# Patient Record
Sex: Male | Born: 1945 | Race: Black or African American | Hispanic: No | Marital: Single | Smoking: Never smoker
Health system: Southern US, Community
[De-identification: ages and names within clinical notes are randomized; demographics above are authoritative.]

## PROBLEM LIST (undated history)

## (undated) DIAGNOSIS — I10 Essential (primary) hypertension: Secondary | ICD-10-CM

---

## 2013-11-06 ENCOUNTER — Other Ambulatory Visit: Payer: Self-pay

## 2013-11-06 ENCOUNTER — Encounter (HOSPITAL_BASED_OUTPATIENT_CLINIC_OR_DEPARTMENT_OTHER): Payer: Self-pay | Admitting: Emergency Medicine

## 2013-11-06 ENCOUNTER — Emergency Department (HOSPITAL_BASED_OUTPATIENT_CLINIC_OR_DEPARTMENT_OTHER): Payer: Medicare Other

## 2013-11-06 ENCOUNTER — Emergency Department (HOSPITAL_BASED_OUTPATIENT_CLINIC_OR_DEPARTMENT_OTHER)
Admission: EM | Admit: 2013-11-06 | Discharge: 2013-11-06 | Disposition: A | Discharge status: Other Acute Inpatient Hospital | Payer: Medicare Other | Attending: Emergency Medicine | Admitting: Emergency Medicine

## 2013-11-06 DIAGNOSIS — R911 Solitary pulmonary nodule: Secondary | ICD-10-CM

## 2013-11-06 DIAGNOSIS — R55 Syncope and collapse: Secondary | ICD-10-CM | POA: Insufficient documentation

## 2013-11-06 DIAGNOSIS — R000 Tachycardia, unspecified: Secondary | ICD-10-CM | POA: Insufficient documentation

## 2013-11-06 DIAGNOSIS — I10 Essential (primary) hypertension: Secondary | ICD-10-CM | POA: Insufficient documentation

## 2013-11-06 DIAGNOSIS — N19 Unspecified kidney failure: Secondary | ICD-10-CM | POA: Insufficient documentation

## 2013-11-06 DIAGNOSIS — Z79899 Other long term (current) drug therapy: Secondary | ICD-10-CM | POA: Insufficient documentation

## 2013-11-06 HISTORY — DX: Essential (primary) hypertension: I10

## 2013-11-06 LAB — URINE MICROSCOPIC-ADD ON

## 2013-11-06 LAB — COMPREHENSIVE METABOLIC PANEL
ALK PHOS: 95 U/L (ref 39–117)
ALT: 34 U/L (ref 0–53)
AST: 43 U/L — AB (ref 0–37)
Albumin: 4.1 g/dL (ref 3.5–5.2)
BUN: 27 mg/dL — ABNORMAL HIGH (ref 6–23)
CALCIUM: 9.7 mg/dL (ref 8.4–10.5)
CO2: 20 meq/L (ref 19–32)
Chloride: 106 mEq/L (ref 96–112)
Creatinine, Ser: 2.9 mg/dL — ABNORMAL HIGH (ref 0.50–1.35)
GFR calc Af Amer: 24 mL/min — ABNORMAL LOW (ref >90)
GFR calc non Af Amer: 21 mL/min — ABNORMAL LOW (ref >90)
Glucose, Bld: 95 mg/dL (ref 70–99)
POTASSIUM: 4.2 meq/L (ref 3.7–5.3)
Sodium: 145 mEq/L (ref 137–147)
Total Bilirubin: 0.6 mg/dL (ref 0.3–1.2)
Total Protein: 7.3 g/dL (ref 6.0–8.3)

## 2013-11-06 LAB — CBC WITH DIFFERENTIAL/PLATELET
BASOS ABS: 0 10*3/uL (ref 0.0–0.1)
Basophils Relative: 0 % (ref 0–1)
EOS PCT: 1 % (ref 0–5)
Eosinophils Absolute: 0.1 10*3/uL (ref 0.0–0.7)
HCT: 35.6 % — ABNORMAL LOW (ref 39.0–52.0)
Hemoglobin: 11.8 g/dL — ABNORMAL LOW (ref 13.0–17.0)
LYMPHS ABS: 1.3 10*3/uL (ref 0.7–4.0)
Lymphocytes Relative: 25 % (ref 12–46)
MCH: 31.3 pg (ref 26.0–34.0)
MCHC: 33.1 g/dL (ref 30.0–36.0)
MCV: 94.4 fL (ref 78.0–100.0)
Monocytes Absolute: 0.6 10*3/uL (ref 0.1–1.0)
Monocytes Relative: 11 % (ref 3–12)
NEUTROS ABS: 3.3 10*3/uL (ref 1.7–7.7)
Neutrophils Relative %: 63 % (ref 43–77)
PLATELETS: 146 10*3/uL — AB (ref 150–400)
RBC: 3.77 MIL/uL — AB (ref 4.22–5.81)
RDW: 12.8 % (ref 11.5–15.5)
WBC: 5.3 10*3/uL (ref 4.0–10.5)

## 2013-11-06 LAB — D-DIMER, QUANTITATIVE (NOT AT ARMC): D DIMER QUANT: 2.74 ug{FEU}/mL — AB (ref 0.00–0.48)

## 2013-11-06 LAB — URINALYSIS, ROUTINE W REFLEX MICROSCOPIC
GLUCOSE, UA: NEGATIVE mg/dL
HGB URINE DIPSTICK: NEGATIVE
Ketones, ur: 15 mg/dL — AB
Leukocytes, UA: NEGATIVE
Nitrite: NEGATIVE
Protein, ur: 30 mg/dL — AB
SPECIFIC GRAVITY, URINE: 1.019 (ref 1.005–1.030)
Urobilinogen, UA: 1 mg/dL (ref 0.0–1.0)
pH: 5 (ref 5.0–8.0)

## 2013-11-06 LAB — TROPONIN I: Troponin I: <0.3 ng/mL (ref <0.30)

## 2013-11-06 MED ORDER — SODIUM CHLORIDE 0.9 % IV BOLUS (SEPSIS)
1000.0000 mL | Freq: Once | INTRAVENOUS | Status: AC
  Administered 2013-11-06: 1000 mL via INTRAVENOUS

## 2013-11-06 NOTE — ED Notes (Signed)
Patient transported to X-ray via stretcher per tech. 

## 2013-11-06 NOTE — ED Notes (Signed)
MD at bedside to discuss pending admission to hospital.

## 2013-11-06 NOTE — ED Notes (Signed)
MD at bedside. 

## 2013-11-06 NOTE — ED Provider Notes (Signed)
CSN: [id number]     Arrival date & time    History  This chart was scribed for Glynn Octave, MD by Phillis Haggis, ED Scribe. This patient was seen in room MH09/MH09 and patient care was started at 4:22 PM.    Chief Complaint  Patient presents with  . Altered Mental Status   The history is provided by the patient and a relative. No language interpreter was used.   HPI Comments: Lance Lang is a 68 y.o. male with a history of hypertension who presents to the Emergency Department complaining of LOC onset earlier today. Patient's family states that the restaurant that they were in was hot and the car he was in had no air conditioning. Patient says that he was sitting at a table with his family at a restaurant when he got hot and was sweating, felt "woozy," lightheaded and everything "going dark."  He put his head on the table when he was sitting and lost consciousness.  He was able to hear people talking but was unable to understand them or respond. Patient's family reports that the patient was drooling and sweating, unable to answer questions in LOC state.  Family denies that he had seizure activity, tongue biting or bladder incontinence and states that he was breathing. This lasted 5 minutes and he woke up on his own.   Patient denies associated chest pain, SOB, nausea, vomiting.  He denies recent falls or head injury.  Pt states he was outside in the heat earlier today and did not eat breakfast this morning.  However he felt well today before this episode occurred.  Patient states that he has had one instance of LOC once before. He denies h/o heart, lung, or kidney problems.   PCP- Dr. Janace Hoard in [facility]   Past Medical History  Diagnosis Date  . Hypertension    History reviewed. No pertinent past surgical history. No family history on file. History  Substance Use Topics  . Smoking status: Never Smoker   . Smokeless tobacco: Not on file  . Alcohol Use: 0.6 - 1.2 oz/week    1-2 Shots of  liquor per week     Comment: daily    Review of Systems A complete 10 system review of systems was obtained and all systems are negative except as noted in the HPI and PMH.    Allergies  Review of patient's allergies indicates no known allergies.  Home Medications   Prior to Admission medications   Medication Sig Start Date End Date Taking? Authorizing Jemmie Ledgerwood  amLODipine (NORVASC) 10 MG tablet Take 10 mg by mouth daily.   Yes Historical Adrinne Sze, MD  simvastatin (ZOCOR) 10 MG tablet Take 10 mg by mouth daily.   Yes Historical Skyy Nilan, MD   BP 121/85  Pulse 76  Temp(Src) 98.1 F (36.7 C) (Oral)  Resp 18  SpO2 97% Physical Exam  Nursing note and vitals reviewed. Constitutional: He is oriented to person, place, and time. He appears well-developed and well-nourished. No distress.  HENT:  Head: Normocephalic and atraumatic.  Mouth/Throat: Oropharynx is clear and moist. No oropharyngeal exudate.  Eyes: Conjunctivae and EOM are normal. Pupils are equal, round, and reactive to light.  Neck: Normal range of motion. Neck supple.  No meningismus.  Cardiovascular: Regular rhythm, normal heart sounds and intact distal pulses.  Tachycardia present.   No murmur heard. HR 100  Pulmonary/Chest: Effort normal and breath sounds normal. No respiratory distress.  Abdominal: Soft. There is no tenderness. There is no  rebound and no guarding.  Musculoskeletal: Normal range of motion. He exhibits no edema and no tenderness.  Neurological: He is alert and oriented to person, place, and time. No cranial nerve deficit. He exhibits normal muscle tone. Coordination normal.  No ataxia on finger to nose bilaterally. No pronator drift. 5/5 strength throughout. CN 2-12 intact. Negative Romberg. Equal grip strength. Sensation intact. Gait is normal.   Skin: Skin is warm.  Psychiatric: He has a normal mood and affect. His behavior is normal.    ED Course  Procedures (including critical care  time) DIAGNOSTIC STUDIES: Oxygen Saturation is 97% on room air, normal by my interpretation.    COORDINATION OF CARE: 4:30 PM-Discussed treatment plan which includes IV fluids, CXR and labs with pt at bedside and pt agreed to plan.   5:15 PM- Discussed lab and x-ray results with pt. Discussed plan to admit patient overnight at [facility] for further examination with pt at bedside and pt agreed to plan.   Medications  sodium chloride 0.9 % bolus 1,000 mL (0 mLs Intravenous Stopped 11/06/13 1955)   Labs Review Labs Reviewed  CBC WITH DIFFERENTIAL - Abnormal; Notable for the following:    RBC 3.77 (*)    Hemoglobin 11.8 (*)    HCT 35.6 (*)    Platelets 146 (*)    All other components within normal limits  COMPREHENSIVE METABOLIC PANEL - Abnormal; Notable for the following:    BUN 27 (*)    Creatinine, Ser 2.90 (*)    AST 43 (*)    GFR calc non Af Amer 21 (*)    GFR calc Af Amer 24 (*)    All other components within normal limits  URINALYSIS, ROUTINE W REFLEX MICROSCOPIC - Abnormal; Notable for the following:    Color, Urine AMBER (*)    APPearance CLOUDY (*)    Bilirubin Urine MODERATE (*)    Ketones, ur 15 (*)    Protein, ur 30 (*)    All other components within normal limits  D-DIMER, QUANTITATIVE - Abnormal; Notable for the following:    D-Dimer, Quant 2.74 (*)    All other components within normal limits  URINE MICROSCOPIC-ADD ON - Abnormal; Notable for the following:    Bacteria, UA MANY (*)    Casts HYALINE CASTS (*)    Crystals URIC ACID CRYSTALS (*)    All other components within normal limits  TROPONIN I    Imaging Review Dg Chest 2 View  11/06/2013   CLINICAL DATA:  Altered mental status.  EXAM: CHEST  2 VIEW  COMPARISON:  No priors.  FINDINGS: 1 cm nodular density projecting the right lower lung seen only on the frontal view. No other suspicious appearing pulmonary nodules or masses are noted. No acute consolidative airspace disease. No pleural effusions. No  evidence of pulmonary edema. Heart size and mediastinal contours are unremarkable.  IMPRESSION: 1. No radiographic evidence of acute cardiopulmonary disease. 2. 1 cm nodular opacity projecting over the right lower lung may either be within the right middle or lower lobe, or possibly within an overlying rib (superimposed over the anterior aspect of the right sixth rib). If the patient has a history of smoking, a follow-up noncontrast chest CT would be recommended in the near future to exclude underlying malignancy. In the absence of a history of smoking, or other history of primary malignancy, a followup standing PA and lateral chest radiograph would be recommended in 2-3 months to ensure the stability or resolution of  this finding   Electronically Signed   By: Trudie Reedaniel  Entrikin M.D.   On: 11/06/2013 16:48   Ct Head Wo Contrast  11/06/2013   CLINICAL DATA:  Loss of consciousness earlier today  EXAM: CT HEAD WITHOUT CONTRAST  TECHNIQUE: Contiguous axial images were obtained from the base of the skull through the vertex without intravenous contrast.  COMPARISON:  None.  FINDINGS: There is no evidence of mass effect, midline shift, or extra-axial fluid collections. There is no evidence of a space-occupying lesion or intracranial hemorrhage. There is no evidence of a cortical-based area of acute infarction. There is generalized cerebral atrophy. There is periventricular white matter low attenuation likely secondary to microangiopathy.  The ventricles and sulci are appropriate for the patient's age. The basal cisterns are patent.  Visualized portions of the orbits are unremarkable. Mild right sphenoid mucosal thickening. Unremarkable mastoid sinuses.  The osseous structures are unremarkable.  IMPRESSION: No acute intracranial pathology.   Electronically Signed   By: Elige KoHetal  Patel   On: 11/06/2013 18:48   Ct Chest Wo Contrast  11/06/2013   CLINICAL DATA:  Abnormal chest x-ray.  EXAM: CT CHEST WITHOUT CONTRAST   TECHNIQUE: Multidetector CT imaging of the chest was performed following the standard protocol without IV contrast.  COMPARISON:  Chest x-ray 11/06/2013.  FINDINGS: Mediastinum: Heart size is normal. There is no significant pericardial fluid, thickening or pericardial calcification. No pathologically enlarged mediastinal or hilar lymph nodes. Please note that accurate exclusion of hilar adenopathy is limited on noncontrast CT scans. Esophagus is unremarkable in appearance.  Lungs/Pleura: There are a few scattered tiny subcentimeter pulmonary nodules, largest of which measures only 5 mm in the inferior segment of the lingula (image 47 of series 3), and 7 mm in the posterior aspect of the right lower lobe (image 51 of series 3). No other larger more suspicious appearing pulmonary nodules or masses are otherwise noted. No acute consolidative airspace disease. No pleural effusions.  Upper Abdomen: Unremarkable.  Musculoskeletal: There are no aggressive appearing lytic or blastic lesions noted in the visualized portions of the skeleton.  IMPRESSION: 1. Multiple small pulmonary nodules scattered throughout the lungs bilaterally measuring up to 7 mm in the right lower lobe and 5 mm in the inferior segment of the lingula. These are nonspecific, but attention on followup studies is recommended. If the patient is at high risk for bronchogenic carcinoma, follow-up chest CT at 3-676months is recommended. If the patient is at low risk for bronchogenic carcinoma, follow-up chest CT at 6-12 months is recommended. This recommendation follows the consensus statement: Guidelines for Management of Small Pulmonary Nodules Detected on CT Scans: A Statement from the Fleischner Society as published in Radiology 2005; 237:395-400. 2. No acute findings in the thorax.   Electronically Signed   By: Trudie Reedaniel  Entrikin M.D.   On: 11/06/2013 18:51     EKG Interpretation   Date/Time:  Sunday November 06 2013 16:23:16 EDT Ventricular Rate:  100 PR  Interval:  162 QRS Duration: 80 QT Interval:  342 QTC Calculation: 441 R Axis:   13 Text Interpretation:  Normal sinus rhythm Possible Left atrial enlargement  Nonspecific ST abnormality Abnormal ECG No previous ECGs available  Confirmed by RANCOUR  MD, STEPHEN [id number]) on 11/06/2013 4:37:27 PM      MDM   Final diagnoses:  Syncope, unspecified syncope type  Renal failure  Lung nodule   Syncopal episode while sitting. Proceeded by lightheadedness, dizziness and nausea. No chest pain or shortness of breath.  EKG with nonspecific T wave flattening in lead 3. No comparison Orthostatics positive. Her rate elevates 107 standing.  Cr 2.9. No comparison. BUN 27 D-dimer elevated at 2.74. Chest x-ray abnormal with lung nodule finding.  Unable to perform CT PE due to elevated creatinine. Will perform noncontrast CT to evaluate lung nodule further. Still needs PE rule out with VQ scan.  Admission discussed with regional physician Dr. Mariea ClontsEmokpae. Patient with likely orthostatic syncope but also has elevated creatinine and lung nodule need to be ruled out for PE. Patient and family in agreement with admission.   I personally performed the services described in this documentation, which was scribed in my presence. The recorded information has been reviewed and is accurate.   Glynn OctaveStephen Rancour, MD 11/07/13 0010

## 2013-11-06 NOTE — ED Notes (Signed)
MD notified of ddimer.

## 2013-11-06 NOTE — ED Notes (Signed)
Out to eat with family. Felt lightheaded, sat down. States that he could hear his family talking, but could not respond. States this episode lasted appx 5 minutes. By the time EMS arrived, pt was alert and oriented. Positive for orthostatic changes with EMS. Patient is alert in triage.

## 2013-11-07 LAB — CBG MONITORING, ED: Glucose-Capillary: 86 mg/dL (ref 70–99)

## 2015-01-20 IMAGING — CT CT CHEST W/O CM
2 of 3 series · 15 of 36 positions shown, 18 images · non-contrast
Comparison: Chest x-ray 11/06/2013.

CLINICAL DATA: Abnormal chest x-ray.

EXAM:
CT CHEST WITHOUT CONTRAST
TECHNIQUE: Multidetector CT imaging of the chest was performed following the
standard protocol without IV contrast..

[Series 2: chest 5.0 b31f · axial · 0.73mm/px · z∈[-338,-48]mm · 12 of 68 slices shown, 15 images]
[im 5/68  mediastinal]
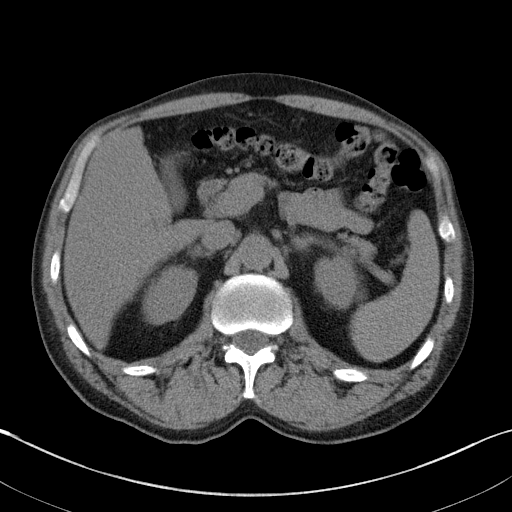
[im 5/68  lung]
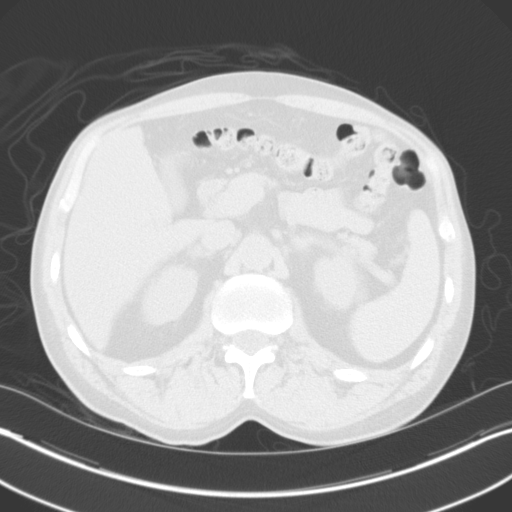
[im 10/68  lung]
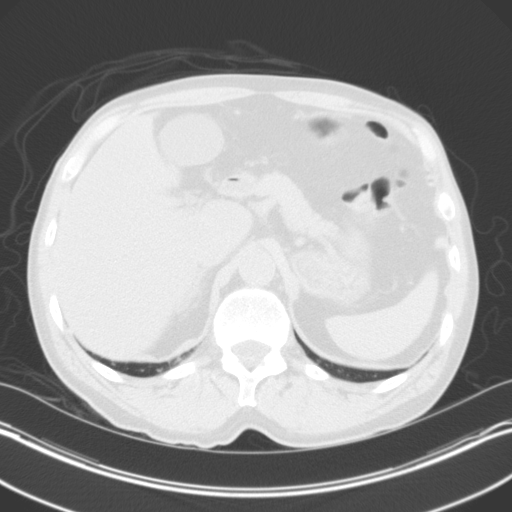
[im 15/68  lung]
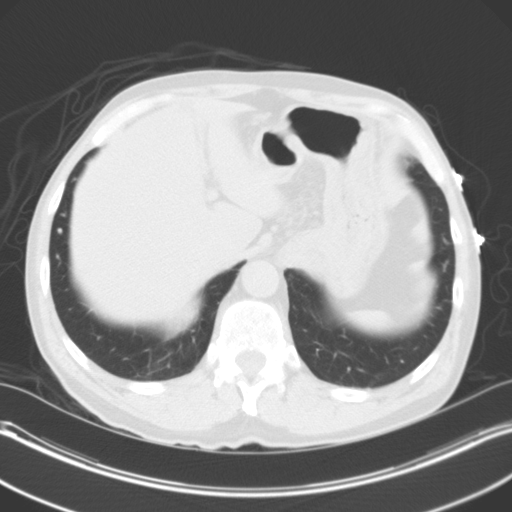
[im 20/68  lung]
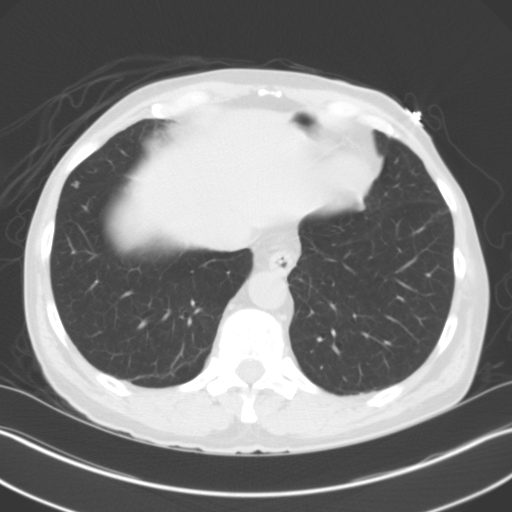
[im 25/68  mediastinal]
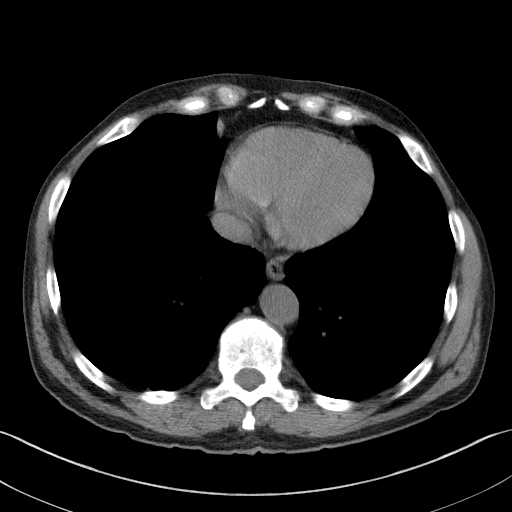
[im 25/68  lung]
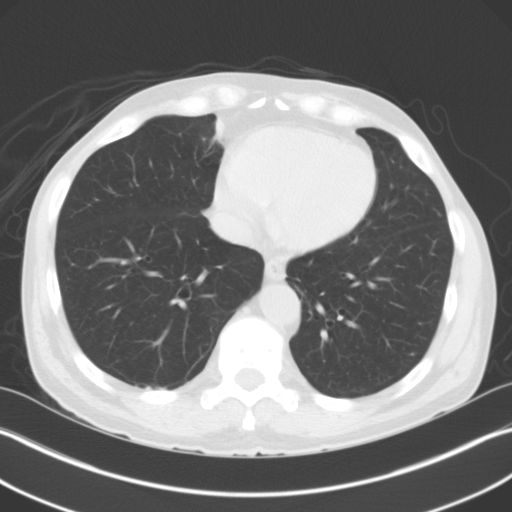
[im 30/68  lung]
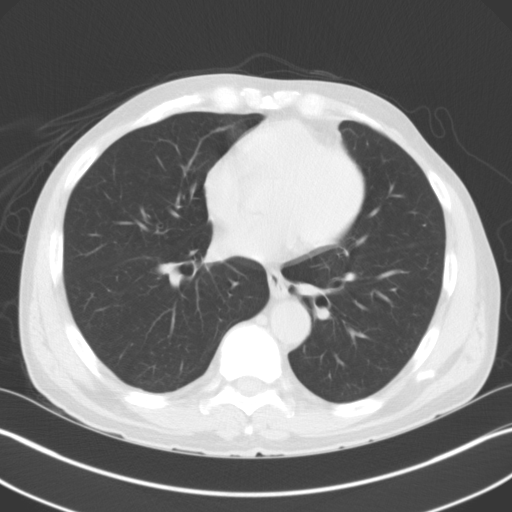
[im 38/68  lung]
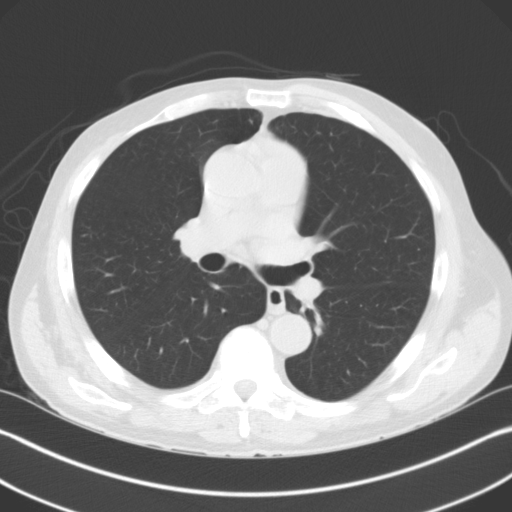
[im 43/68  lung]
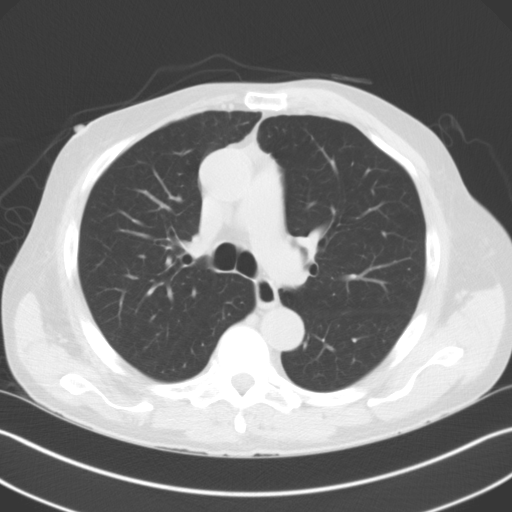
[im 48/68  mediastinal]
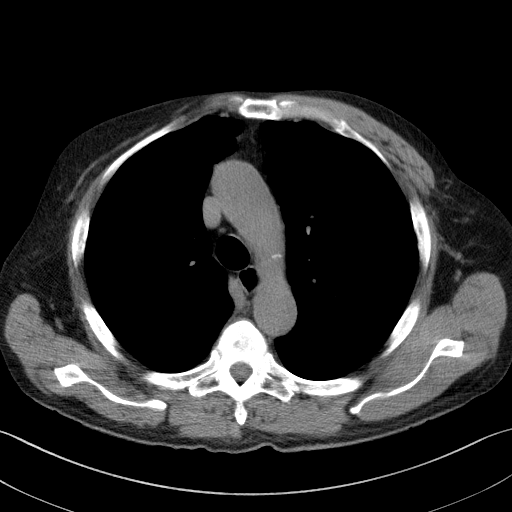
[im 48/68  lung]
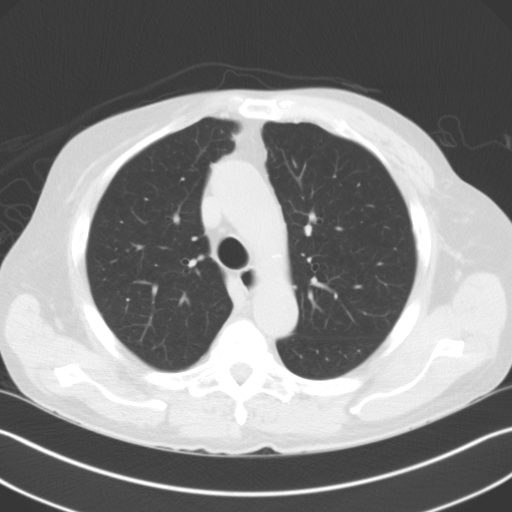
[im 53/68  lung]
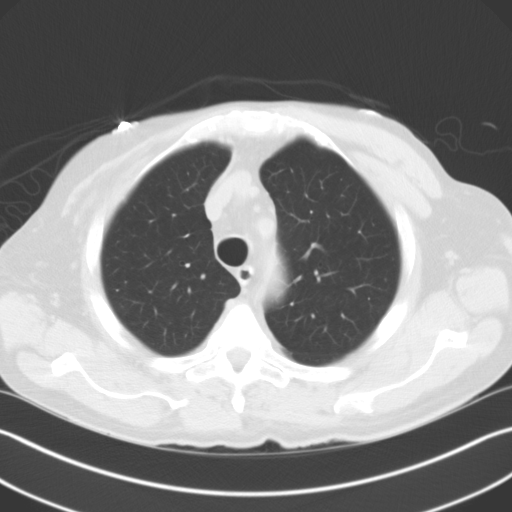
[im 58/68  lung]
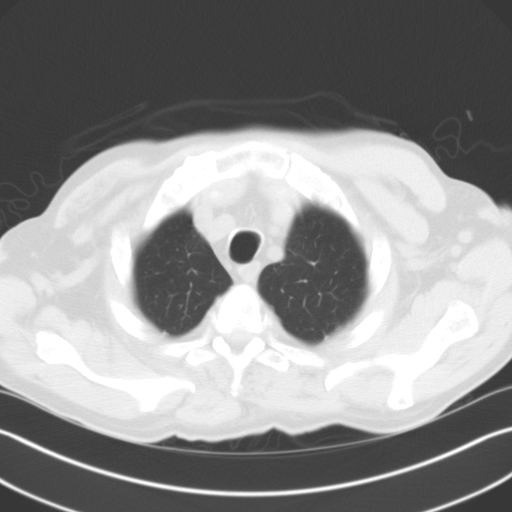
[im 63/68  lung]
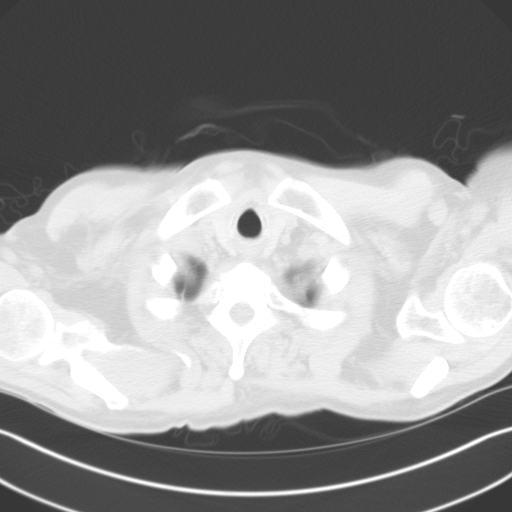

[Series 6: chest 3.0 coronal · coronal · 0.66mm/px · 3 of 90 slices shown]
[im 18/90  lung]
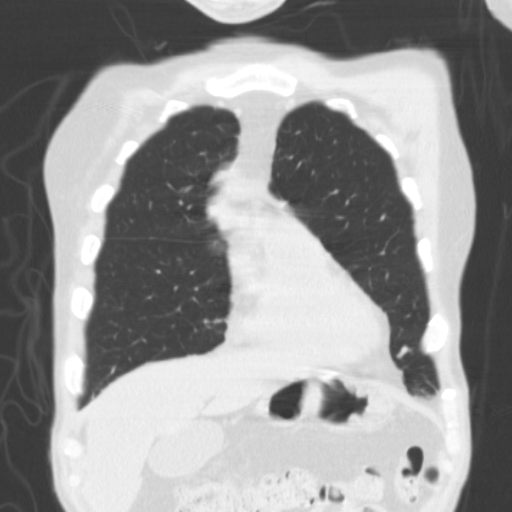
[im 36/90  lung]
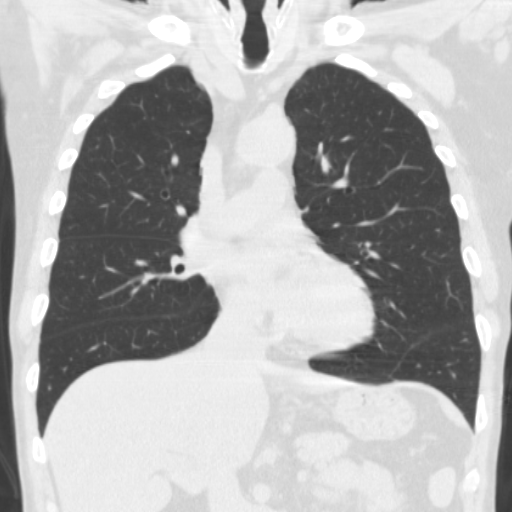
[im 54/90  lung]
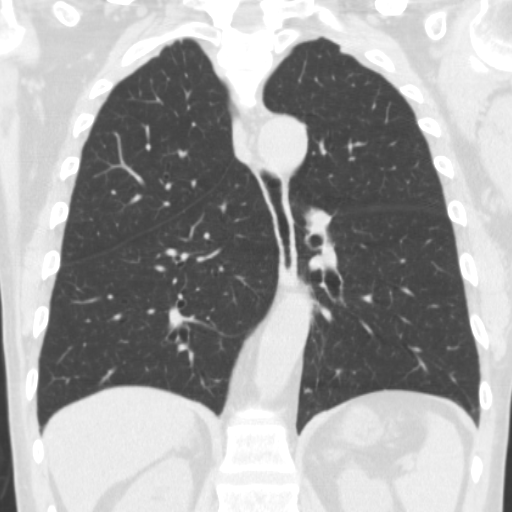

[15 of 36 positions shown; findings below may reference images not displayed]

FINDINGS: Mediastinum: Heart size is normal. There is no significant
pericardial fluid, thickening or pericardial calcification. No
pathologically enlarged mediastinal or hilar lymph nodes. Please
note that accurate exclusion of hilar adenopathy is limited on
noncontrast CT scans. Esophagus is unremarkable in appearance.

Lungs/Pleura: There are a few scattered tiny subcentimeter pulmonary
nodules, largest of which measures only 5 mm in the inferior segment
of the lingula (image 47 of series 3), and 7 mm in the posterior
aspect of the right lower lobe (image 51 of series 3). No other
larger more suspicious appearing pulmonary nodules or masses are
otherwise noted. No acute consolidative airspace disease. No pleural
effusions.

Upper Abdomen: Unremarkable.

Musculoskeletal: There are no aggressive appearing lytic or blastic
lesions noted in the visualized portions of the skeleton.
IMPRESSION: 1. Multiple small pulmonary nodules scattered throughout the lungs
bilaterally measuring up to 7 mm in the right lower lobe and 5 mm in
the inferior segment of the lingula. These are nonspecific, but
attention on followup studies is recommended. If the patient is at
high risk for bronchogenic carcinoma, follow-up chest CT at
3-6months is recommended. If the patient is at low risk for
bronchogenic carcinoma, follow-up chest CT at 6-12 months is
recommended. This recommendation follows the consensus statement:
Guidelines for Management of Small Pulmonary Nodules Detected on CT
Scans: A Statement from the [HOSPITAL] as published in
2. No acute findings in the thorax.

## 2015-01-20 IMAGING — CT CT HEAD W/O CM
1 series · 16 of 30 positions shown, 20 images · non-contrast
Comparison: None.

CLINICAL DATA: Loss of consciousness earlier today

EXAM:
CT HEAD WITHOUT CONTRAST
TECHNIQUE: Contiguous axial images were obtained from the base of the skull
through the vertex without intravenous contrast.

[Series 8: head 4.8 h37s · axial · 0.47mm/px · z∈[-49,+103]mm · 16 of 36 slices shown, 20 images]
[im 2/36  brain]
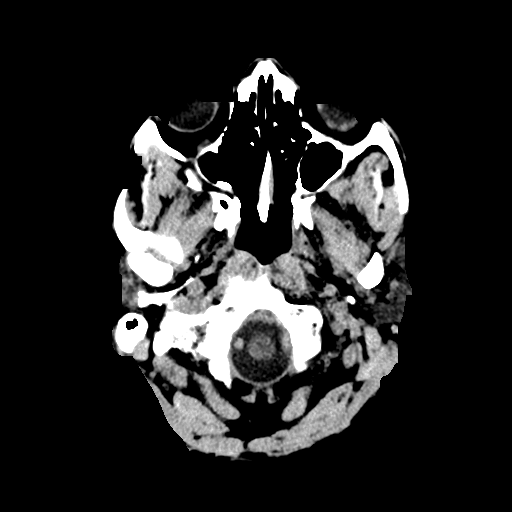
[im 2/36  bone]
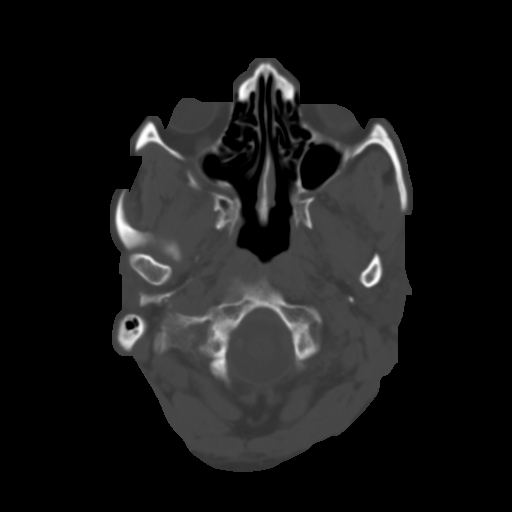
[im 4/36  brain]
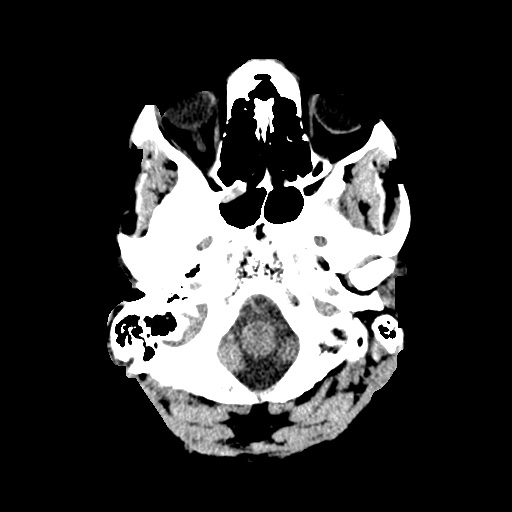
[im 7/36  brain]
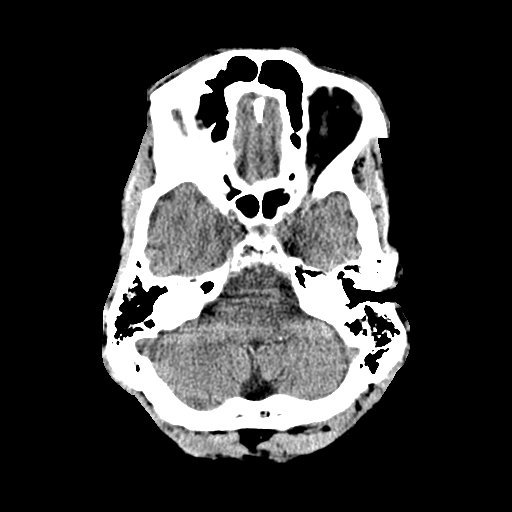
[im 9/36  brain]
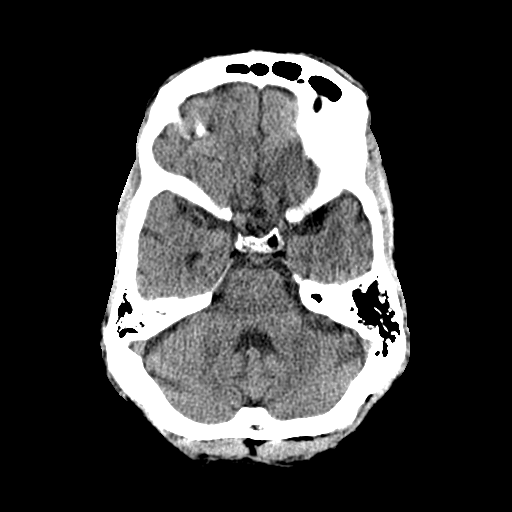
[im 10/36  brain]
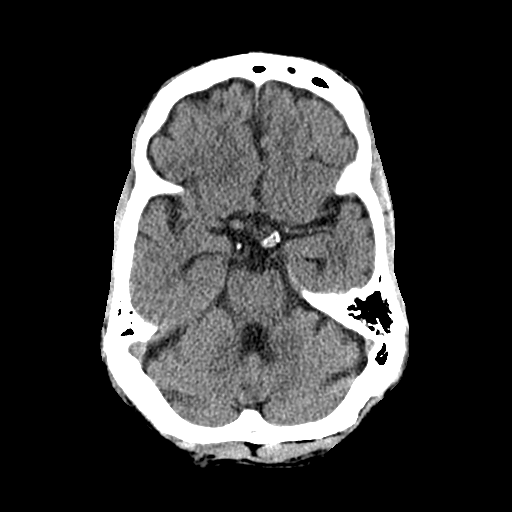
[im 10/36  bone]
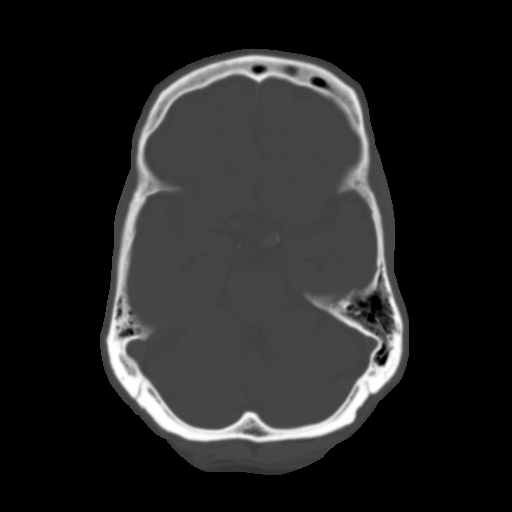
[im 13/36  brain]
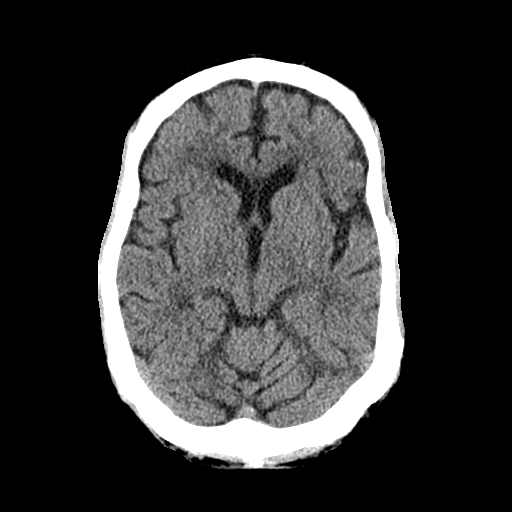
[im 15/36  brain]
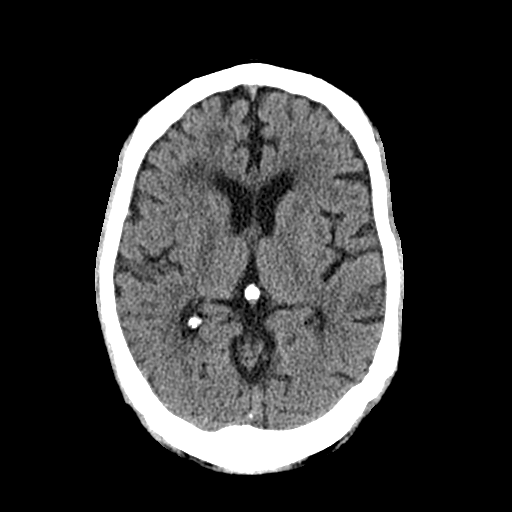
[im 17/36  brain]
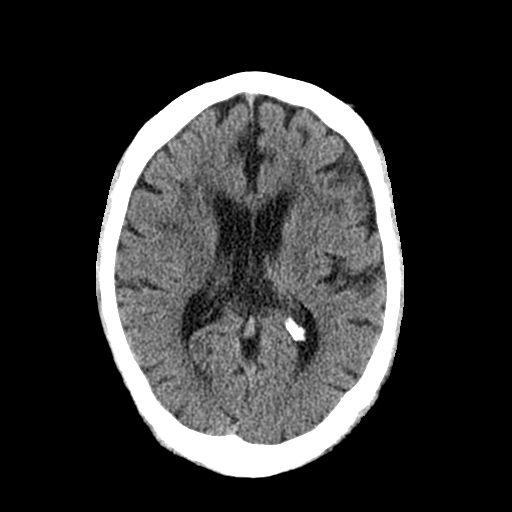
[im 19/36  brain]
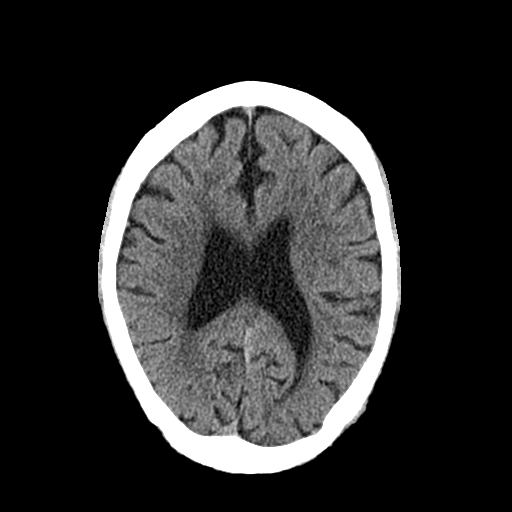
[im 19/36  bone]
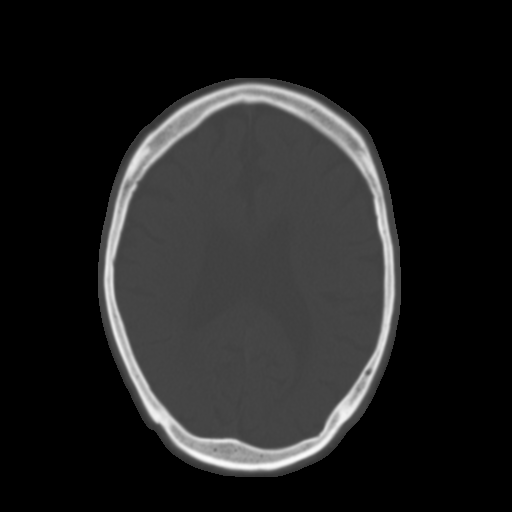
[im 21/36  brain]
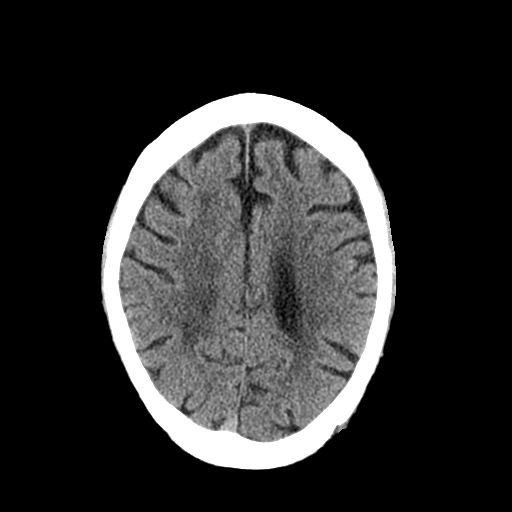
[im 23/36  brain]
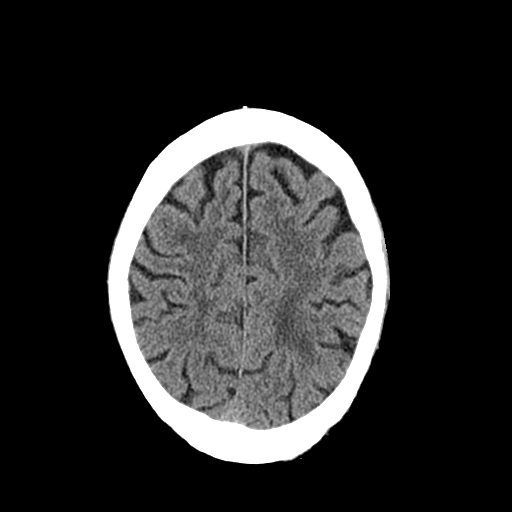
[im 26/36  brain]
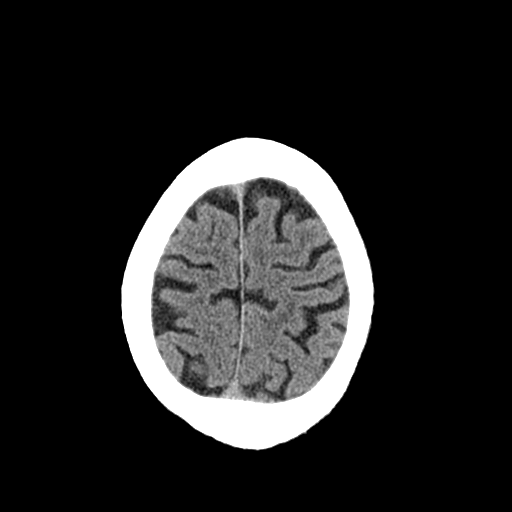
[im 27/36  brain]
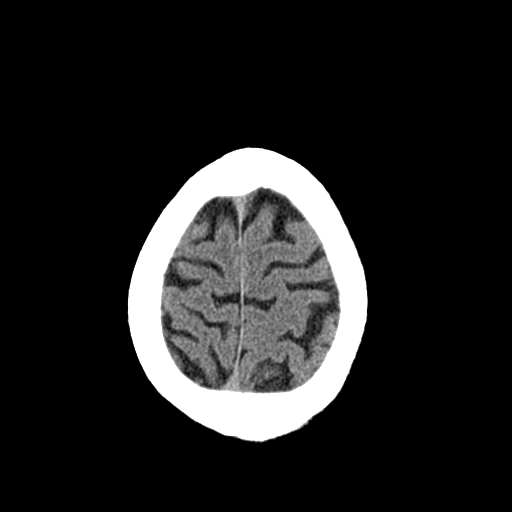
[im 27/36  bone]
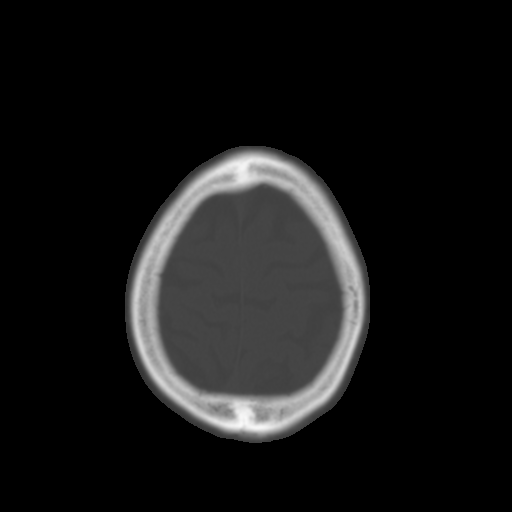
[im 29/36  brain]
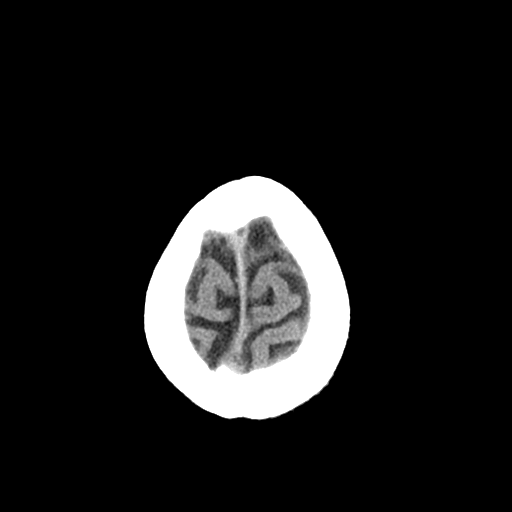
[im 32/36  brain]
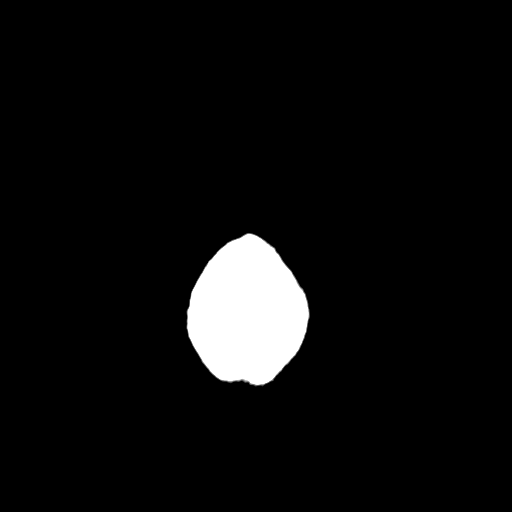
[im 34/36  brain]
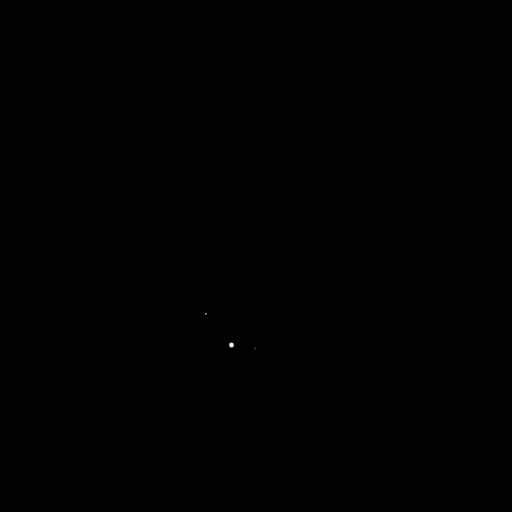

[16 of 30 positions shown; findings below may reference images not displayed]

FINDINGS: There is no evidence of mass effect, midline shift, or extra-axial
fluid collections. There is no evidence of a space-occupying lesion
or intracranial hemorrhage. There is no evidence of a cortical-based
area of acute infarction. There is generalized cerebral atrophy.
There is periventricular white matter low attenuation likely
secondary to microangiopathy.

The ventricles and sulci are appropriate for the patient's age. The
basal cisterns are patent.

Visualized portions of the orbits are unremarkable. Mild right
sphenoid mucosal thickening. Unremarkable mastoid sinuses.

The osseous structures are unremarkable.
IMPRESSION: No acute intracranial pathology.

## 2017-05-19 DEATH — deceased
